# Patient Record
Sex: Female | Born: 1945 | Race: White | Hispanic: No | Marital: Married | Smoking: Never smoker
Health system: Southern US, Community
[De-identification: ages and names within clinical notes are randomized; demographics above are authoritative.]

## PROBLEM LIST (undated history)

## (undated) DIAGNOSIS — I10 Essential (primary) hypertension: Secondary | ICD-10-CM

## (undated) DIAGNOSIS — C801 Malignant (primary) neoplasm, unspecified: Secondary | ICD-10-CM

## (undated) HISTORY — PX: ABDOMINAL HYSTERECTOMY: SHX81

---

## 2013-03-23 ENCOUNTER — Ambulatory Visit: Payer: Self-pay | Admitting: Internal Medicine

## 2014-04-23 ENCOUNTER — Ambulatory Visit: Payer: Self-pay | Admitting: Internal Medicine

## 2015-04-09 ENCOUNTER — Other Ambulatory Visit: Payer: Self-pay | Admitting: Internal Medicine

## 2015-04-09 DIAGNOSIS — Z1231 Encounter for screening mammogram for malignant neoplasm of breast: Secondary | ICD-10-CM

## 2015-04-29 ENCOUNTER — Ambulatory Visit
Admission: RE | Admit: 2015-04-29 | Discharge: 2015-04-29 | Disposition: A | Discharge status: Home / Self Care | Payer: Medicare Other | Source: Ambulatory Visit | Attending: Internal Medicine | Admitting: Internal Medicine

## 2015-04-29 DIAGNOSIS — Z1231 Encounter for screening mammogram for malignant neoplasm of breast: Secondary | ICD-10-CM | POA: Diagnosis present

## 2015-04-29 HISTORY — DX: Malignant (primary) neoplasm, unspecified: C80.1

## 2016-04-27 ENCOUNTER — Other Ambulatory Visit: Payer: Self-pay | Admitting: Internal Medicine

## 2016-04-27 DIAGNOSIS — Z1231 Encounter for screening mammogram for malignant neoplasm of breast: Secondary | ICD-10-CM

## 2016-05-05 ENCOUNTER — Ambulatory Visit
Admission: RE | Admit: 2016-05-05 | Discharge: 2016-05-05 | Disposition: A | Discharge status: Home / Self Care | Payer: Medicare Other | Source: Ambulatory Visit | Attending: Internal Medicine | Admitting: Internal Medicine

## 2016-05-05 DIAGNOSIS — Z1231 Encounter for screening mammogram for malignant neoplasm of breast: Secondary | ICD-10-CM | POA: Diagnosis present

## 2016-08-17 ENCOUNTER — Encounter: Payer: Self-pay | Admitting: Emergency Medicine

## 2016-08-17 ENCOUNTER — Emergency Department
Admission: EM | Admit: 2016-08-17 | Discharge: 2016-08-17 | Disposition: A | Discharge status: Home / Self Care | Payer: Medicare Other | Attending: Student in an Organized Health Care Education/Training Program | Admitting: Student in an Organized Health Care Education/Training Program

## 2016-08-17 DIAGNOSIS — Z85828 Personal history of other malignant neoplasm of skin: Secondary | ICD-10-CM | POA: Insufficient documentation

## 2016-08-17 DIAGNOSIS — K529 Noninfective gastroenteritis and colitis, unspecified: Secondary | ICD-10-CM | POA: Diagnosis not present

## 2016-08-17 DIAGNOSIS — R197 Diarrhea, unspecified: Secondary | ICD-10-CM

## 2016-08-17 LAB — CBC WITH DIFFERENTIAL/PLATELET
BASOS ABS: 0 10*3/uL (ref 0–0.1)
BASOS PCT: 1 %
EOS ABS: 0.1 10*3/uL (ref 0–0.7)
Eosinophils Relative: 1 %
HCT: 43.9 % (ref 35.0–47.0)
HEMOGLOBIN: 14.9 g/dL (ref 12.0–16.0)
Lymphocytes Relative: 17 %
Lymphs Abs: 1.1 10*3/uL (ref 1.0–3.6)
MCH: 30.5 pg (ref 26.0–34.0)
MCHC: 34.1 g/dL (ref 32.0–36.0)
MCV: 89.6 fL (ref 80.0–100.0)
MONOS PCT: 11 %
Monocytes Absolute: 0.7 10*3/uL (ref 0.2–0.9)
Neutro Abs: 4.6 10*3/uL (ref 1.4–6.5)
Neutrophils Relative %: 70 %
Platelets: 241 10*3/uL (ref 150–440)
RBC: 4.89 MIL/uL (ref 3.80–5.20)
RDW: 13.1 % (ref 11.5–14.5)
WBC: 6.4 10*3/uL (ref 3.6–11.0)

## 2016-08-17 LAB — COMPREHENSIVE METABOLIC PANEL
ALBUMIN: 4.2 g/dL (ref 3.5–5.0)
ALK PHOS: 61 U/L (ref 38–126)
ALT: 23 U/L (ref 14–54)
ANION GAP: 9 (ref 5–15)
AST: 28 U/L (ref 15–41)
BUN: 17 mg/dL (ref 6–20)
CO2: 23 mmol/L (ref 22–32)
Calcium: 9.5 mg/dL (ref 8.9–10.3)
Chloride: 108 mmol/L (ref 101–111)
Creatinine, Ser: 0.66 mg/dL (ref 0.44–1.00)
GFR calc Af Amer: >60 mL/min (ref >60)
GFR calc non Af Amer: >60 mL/min (ref >60)
Glucose, Bld: 114 mg/dL — ABNORMAL HIGH (ref 65–99)
POTASSIUM: 3.7 mmol/L (ref 3.5–5.1)
SODIUM: 140 mmol/L (ref 135–145)
Total Bilirubin: 0.7 mg/dL (ref 0.3–1.2)
Total Protein: 6.9 g/dL (ref 6.5–8.1)

## 2016-08-17 LAB — LACTIC ACID, PLASMA: Lactic Acid, Venous: 1.3 mmol/L (ref 0.5–1.9)

## 2016-08-17 MED ORDER — SODIUM CHLORIDE 0.9 % IV BOLUS (SEPSIS)
1000.0000 mL | Freq: Once | INTRAVENOUS | Status: AC
  Administered 2016-08-17: 1000 mL via INTRAVENOUS

## 2016-08-17 MED ORDER — METRONIDAZOLE 500 MG PO TABS
500.0000 mg | ORAL_TABLET | Freq: Once | ORAL | Status: AC
  Administered 2016-08-17: 500 mg via ORAL
  Filled 2016-08-17: qty 1

## 2016-08-17 MED ORDER — METRONIDAZOLE 500 MG PO TABS: 500.0000 mg | ORAL_TABLET | Freq: Three times a day (TID) | ORAL | 0 refills | Status: AC

## 2016-08-17 MED ORDER — CIPROFLOXACIN HCL 500 MG PO TABS: 500.0000 mg | ORAL_TABLET | Freq: Two times a day (BID) | ORAL | 0 refills | Status: AC

## 2016-08-17 MED ORDER — SODIUM CHLORIDE 0.9 % IV BOLUS (SEPSIS): 1000.0000 mL | Freq: Once | INTRAVENOUS | Status: DC

## 2016-08-17 NOTE — ED Notes (Signed)
Patient denies pain and is resting comfortably.  

## 2016-08-17 NOTE — ED Triage Notes (Signed)
Pt to ed with c/o diarrhea x 4 weeks, approx 5 times per day.

## 2016-08-17 NOTE — ED Provider Notes (Addendum)
[facility] Emergency Department Provider Note    First MD Initiated Contact with Patient 08/17/16 1119     (approximate)  I have reviewed the triage vital signs and the nursing notes.   HISTORY  Chief Complaint Diarrhea    HPI Tracy Charles is a 71 y.o. female presents with several weeks of very foul-smelling "explosive" diarrhea. Is having 4-5 episodes per day. Diarrhea is nonbloody. No green stool. No dark black stool. Patient with recent travel to [place] and states that she thinks she got something while eating at the diner[employer]. States that the smell has become more foul. Has tried Imodium without any improvement. Denies any history of food allergies. No recent antibiotic use. [facility] and was directed to the ER. She denies any pain.   Past Medical History:  Diagnosis Date  . Cancer ([place])    skin ca   Family History  Problem Relation Age of Onset  . Breast cancer Maternal Aunt 13  . Breast cancer Maternal Grandmother     premenopausal   Past Surgical History:  Procedure Laterality Date  . ABDOMINAL HYSTERECTOMY     There are no active problems to display for this patient.     Prior to Admission medications   Medication Sig Start Date End Date Taking? Authorizing Provider  ciprofloxacin (CIPRO) 500 MG tablet Take 1 tablet (500 mg total) by mouth 2 (two) times daily. 08/17/16 08/27/16  Merlyn Lot, MD  metroNIDAZOLE (FLAGYL) 500 MG tablet Take 1 tablet (500 mg total) by mouth 3 (three) times daily. 08/17/16 08/24/16  Merlyn Lot, MD    Allergies Patient has no known allergies.    Social History Social History  Substance Use Topics  . Smoking status: Never Smoker  . Smokeless tobacco: Never Used  . Alcohol use No    Review of Systems Patient denies headaches, rhinorrhea, blurry vision, numbness, shortness of breath, chest pain, edema, cough, abdominal pain,  nausea, vomiting, diarrhea, dysuria, fevers, rashes or hallucinations unless otherwise stated above in HPI. ____________________________________________   PHYSICAL EXAM:  VITAL SIGNS: Vitals:   08/17/16 1041  BP: (!) 158/79  Pulse: 73  Resp: 16  Temp: 98.4 F (36.9 C)    Constitutional: Alert and oriented. Well appearing and in no acute distress. Eyes: Conjunctivae are normal. PERRL. EOMI. Head: Atraumatic. Nose: No congestion/rhinnorhea. Mouth/Throat: Mucous membranes are moist.  Oropharynx non-erythematous. Neck: No stridor. Painless ROM. No cervical spine tenderness to palpation Hematological/Lymphatic/Immunilogical: No cervical lymphadenopathy. Cardiovascular: Normal rate, regular rhythm. Grossly normal heart sounds.  Good peripheral circulation. Respiratory: Normal respiratory effort.  No retractions. Lungs CTAB. Gastrointestinal: Soft and nontender. No distention. No abdominal bruits. No CVA tenderness. Genitourinary:  Musculoskeletal: No lower extremity tenderness nor edema.  No joint effusions. Neurologic:  Normal speech and language. No gross focal neurologic deficits are appreciated. No gait instability. Skin:  Skin is warm, dry and intact. No rash noted. Psychiatric: Mood and affect are normal. Speech and behavior are normal.  ____________________________________________   LABS (all labs ordered are listed, but only abnormal results are displayed)  Results for orders placed or performed during the hospital encounter of 08/17/16 (from the past 24 hour(s))  CBC with Differential/Platelet     Status: None   Collection Time: 08/17/16 11:44 AM  Result Value Ref Range   WBC 6.4 3.6 - 11.0 K/uL   RBC 4.89 3.80 - 5.20 MIL/uL   Hemoglobin 14.9 12.0 - 16.0 g/dL  HCT 43.9 35.0 - 47.0 %   MCV 89.6 80.0 - 100.0 fL   MCH 30.5 26.0 - 34.0 pg   MCHC 34.1 32.0 - 36.0 g/dL   RDW 13.1 11.5 - 14.5 %   Platelets 241 150 - 440 K/uL   Neutrophils Relative % 70 %   Neutro Abs  4.6 1.4 - 6.5 K/uL   Lymphocytes Relative 17 %   Lymphs Abs 1.1 1.0 - 3.6 K/uL   Monocytes Relative 11 %   Monocytes Absolute 0.7 0.2 - 0.9 K/uL   Eosinophils Relative 1 %   Eosinophils Absolute 0.1 0 - 0.7 K/uL   Basophils Relative 1 %   Basophils Absolute 0.0 0 - 0.1 K/uL  Comprehensive metabolic panel     Status: Abnormal   Collection Time: 08/17/16 11:44 AM  Result Value Ref Range   Sodium 140 135 - 145 mmol/L   Potassium 3.7 3.5 - 5.1 mmol/L   Chloride 108 101 - 111 mmol/L   CO2 23 22 - 32 mmol/L   Glucose, Bld 114 (H) 65 - 99 mg/dL   BUN 17 6 - 20 mg/dL   Creatinine, Ser 0.66 0.44 - 1.00 mg/dL   Calcium 9.5 8.9 - 10.3 mg/dL   Total Protein 6.9 6.5 - 8.1 g/dL   Albumin 4.2 3.5 - 5.0 g/dL   AST 28 15 - 41 U/L   ALT 23 14 - 54 U/L   Alkaline Phosphatase 61 38 - 126 U/L   Total Bilirubin 0.7 0.3 - 1.2 mg/dL   GFR calc non Af Amer >60 >60 mL/min   GFR calc Af Amer >60 >60 mL/min   Anion gap 9 5 - 15  Lactic acid, plasma     Status: None   Collection Time: 08/17/16 11:44 AM  Result Value Ref Range   Lactic Acid, Venous 1.3 0.5 - 1.9 mmol/L   ____________________________________________ ____________________________________________  RADIOLOGY    ____________________________________________   PROCEDURES  Procedure(s) performed:  Procedures    Critical Care performed: no ____________________________________________   INITIAL IMPRESSION / ASSESSMENT AND PLAN / ED COURSE  Pertinent labs & imaging results that were available during my care of the patient were reviewed by me and considered in my medical decision making (see chart for details).  DDX: enteritis, colitis, diverticulitis, dehydration  Kashira Behunin is a 71 y.o. who presents to the ED with long history of diarrhea as described above.Patient is AFVSS in ED. Exam as above. Given current presentation have considered the above differential. Her abdominal exam is soft and benign. Do not feel that CT  imaging is clinically indicated. Patient likely suffering ROM some component of traveler's diarrhea however foul-smelling and large amount of flatulence would suggest a component of Giardia. Will send for stool cultures. Blood work is otherwise reassuring. IV fluids given for dehydration. No evidence of acidosis. We will start patient on antibiotics as symptoms not improving after several weeks of conservative management. Not clinically consistent with C. difficile. Patient has follow-up with PCP.   Patient was able to tolerate PO and was able to ambulate with a steady gait.  Have discussed with the patient and available family all diagnostics and treatments performed thus far and all questions were answered to the best of my ability. The patient demonstrates understanding and agreement with plan.        ____________________________________________   FINAL CLINICAL IMPRESSION(S) / ED DIAGNOSES  Final diagnoses:  Diarrhea of presumed infectious origin      NEW MEDICATIONS  STARTED DURING THIS VISIT:  New Prescriptions   CIPROFLOXACIN (CIPRO) 500 MG TABLET    Take 1 tablet (500 mg total) by mouth 2 (two) times daily.   METRONIDAZOLE (FLAGYL) 500 MG TABLET    Take 1 tablet (500 mg total) by mouth 3 (three) times daily.     Note:  This document was prepared using Dragon voice recognition software and may include unintentional dictation errors.    Merlyn Lot, MD 08/17/16 [place], MD 08/17/16 1248

## 2017-03-15 ENCOUNTER — Other Ambulatory Visit: Payer: Self-pay | Admitting: Internal Medicine

## 2017-03-15 ENCOUNTER — Ambulatory Visit
Admission: RE | Admit: 2017-03-15 | Discharge: 2017-03-15 | Disposition: A | Discharge status: Home / Self Care | Payer: Medicare Other | Source: Ambulatory Visit | Attending: Internal Medicine | Admitting: Internal Medicine

## 2017-03-15 DIAGNOSIS — M4316 Spondylolisthesis, lumbar region: Secondary | ICD-10-CM | POA: Diagnosis not present

## 2017-03-15 DIAGNOSIS — M48061 Spinal stenosis, lumbar region without neurogenic claudication: Secondary | ICD-10-CM | POA: Insufficient documentation

## 2017-03-15 DIAGNOSIS — M5432 Sciatica, left side: Secondary | ICD-10-CM | POA: Insufficient documentation

## 2017-03-15 DIAGNOSIS — M47816 Spondylosis without myelopathy or radiculopathy, lumbar region: Secondary | ICD-10-CM | POA: Insufficient documentation

## 2017-03-15 DIAGNOSIS — M79605 Pain in left leg: Secondary | ICD-10-CM

## 2017-03-15 DIAGNOSIS — M5127 Other intervertebral disc displacement, lumbosacral region: Secondary | ICD-10-CM | POA: Insufficient documentation

## 2017-03-15 DIAGNOSIS — M545 Low back pain: Secondary | ICD-10-CM

## 2017-05-24 ENCOUNTER — Other Ambulatory Visit: Payer: Self-pay | Admitting: Internal Medicine

## 2017-05-24 DIAGNOSIS — Z1231 Encounter for screening mammogram for malignant neoplasm of breast: Secondary | ICD-10-CM

## 2017-05-31 ENCOUNTER — Ambulatory Visit
Admission: RE | Admit: 2017-05-31 | Discharge: 2017-05-31 | Disposition: A | Discharge status: Home / Self Care | Payer: Medicare Other | Source: Ambulatory Visit | Attending: Internal Medicine | Admitting: Internal Medicine

## 2017-05-31 DIAGNOSIS — Z1231 Encounter for screening mammogram for malignant neoplasm of breast: Secondary | ICD-10-CM

## 2018-05-17 ENCOUNTER — Other Ambulatory Visit: Payer: Self-pay | Admitting: Internal Medicine

## 2018-05-17 DIAGNOSIS — Z1231 Encounter for screening mammogram for malignant neoplasm of breast: Secondary | ICD-10-CM

## 2018-06-01 ENCOUNTER — Ambulatory Visit
Admission: RE | Admit: 2018-06-01 | Discharge: 2018-06-01 | Disposition: A | Discharge status: Home / Self Care | Payer: Medicare Other | Source: Ambulatory Visit | Attending: Internal Medicine | Admitting: Internal Medicine

## 2018-06-01 ENCOUNTER — Encounter (INDEPENDENT_AMBULATORY_CARE_PROVIDER_SITE_OTHER): Payer: Self-pay

## 2018-06-01 DIAGNOSIS — Z1231 Encounter for screening mammogram for malignant neoplasm of breast: Secondary | ICD-10-CM

## 2019-01-09 ENCOUNTER — Other Ambulatory Visit: Payer: Self-pay | Admitting: Unknown Physician Specialty

## 2019-01-09 DIAGNOSIS — R220 Localized swelling, mass and lump, head: Secondary | ICD-10-CM

## 2019-01-17 ENCOUNTER — Ambulatory Visit
Admission: RE | Admit: 2019-01-17 | Discharge: 2019-01-17 | Disposition: A | Discharge status: Home / Self Care | Payer: Medicare Other | Source: Ambulatory Visit | Attending: Unknown Physician Specialty | Admitting: Unknown Physician Specialty

## 2019-01-17 ENCOUNTER — Other Ambulatory Visit: Payer: Self-pay

## 2019-01-17 DIAGNOSIS — R220 Localized swelling, mass and lump, head: Secondary | ICD-10-CM | POA: Diagnosis present

## 2019-01-17 HISTORY — DX: Essential (primary) hypertension: I10

## 2019-01-17 LAB — POCT I-STAT CREATININE: Creatinine, Ser: 0.6 mg/dL (ref 0.44–1.00)

## 2019-01-17 MED ORDER — IOHEXOL 300 MG/ML  SOLN
75.0000 mL | Freq: Once | INTRAMUSCULAR | Status: AC | PRN
  Administered 2019-01-17: 14:00:00 75 mL via INTRAVENOUS

## 2019-01-19 ENCOUNTER — Other Ambulatory Visit: Payer: Self-pay | Admitting: Unknown Physician Specialty

## 2019-01-19 ENCOUNTER — Other Ambulatory Visit (HOSPITAL_COMMUNITY): Payer: Self-pay | Admitting: Unknown Physician Specialty

## 2019-01-19 DIAGNOSIS — R911 Solitary pulmonary nodule: Secondary | ICD-10-CM

## 2019-01-22 ENCOUNTER — Other Ambulatory Visit: Payer: Self-pay | Admitting: Unknown Physician Specialty

## 2019-01-22 DIAGNOSIS — R221 Localized swelling, mass and lump, neck: Secondary | ICD-10-CM

## 2019-01-24 ENCOUNTER — Other Ambulatory Visit: Payer: Self-pay | Admitting: Radiology

## 2019-01-25 ENCOUNTER — Other Ambulatory Visit: Payer: Self-pay

## 2019-01-25 ENCOUNTER — Ambulatory Visit
Admission: RE | Admit: 2019-01-25 | Discharge: 2019-01-25 | Disposition: A | Discharge status: Home / Self Care | Payer: Medicare Other | Source: Ambulatory Visit | Attending: Unknown Physician Specialty | Admitting: Unknown Physician Specialty

## 2019-01-25 DIAGNOSIS — R221 Localized swelling, mass and lump, neck: Secondary | ICD-10-CM | POA: Diagnosis present

## 2019-01-25 MED ORDER — SODIUM CHLORIDE 0.9 % IV SOLN: INTRAVENOUS | Status: DC

## 2019-01-25 NOTE — Procedures (Signed)
Interventional Radiology Procedure:   Indications: Palpable left submandibular lesion   Procedure: US guided FNA and core biopsy  Findings: Irregular poor-defined lesion.  4 FNAs and 3 cores obtained.  Complications: None     EBL: less than 10 ml  Plan: Discharge to home.   Gemma Ruan R. Anselm Pancoast, MD  Pager: [phone]

## 2019-01-25 NOTE — Discharge Instructions (Signed)
Needle Biopsy, Care After °These instructions tell you how to care for yourself after your procedure. Your doctor may also give you more specific instructions. Call your doctor if you have any problems or questions. °What can I expect after the procedure? °After the procedure, it is common to have: °· Soreness. °· Bruising. °· Mild pain. °Follow these instructions at home: ° °· Return to your normal activities as told by your doctor. Ask your doctor what activities are safe for you. °· Take over-the-counter and prescription medicines only as told by your doctor. °· Wash your hands with soap and water before you change your bandage (dressing). If you cannot use soap and water, use hand sanitizer. °· Follow instructions from your doctor about: °? How to take care of your puncture site. °? When and how to change your bandage. °? When to remove your bandage. °· Check your puncture site every day for signs of infection. Watch for: °? Redness, swelling, or pain. °? Fluid or blood.  °? Pus or a bad smell. °? Warmth. °· Do not take baths, swim, or use a hot tub until your doctor approves. Ask your doctor if you may take showers. You may only be allowed to take sponge baths. °· Keep all follow-up visits as told by your doctor. This is important. °Contact a doctor if you have: °· A fever. °· Redness, swelling, or pain at the puncture site, and it lasts longer than a few days. °· Fluid, blood, or pus coming from the puncture site. °· Warmth coming from the puncture site. °Get help right away if: °· You have a lot of bleeding from the puncture site. °Summary °· After the procedure, it is common to have soreness, bruising, or mild pain at the puncture site. °· Check your puncture site every day for signs of infection, such as redness, swelling, or pain. °· Get help right away if you have severe bleeding from your puncture site. °This information is not intended to replace advice given to you by your health care provider. Make  sure you discuss any questions you have with your health care provider. °Document Released: 04/01/2008 Document Revised: 05/02/2017 Document Reviewed: 05/02/2017 °Elsevier Patient Education © 2020 [employer] ° °

## 2019-01-26 LAB — CYTOLOGY - NON PAP

## 2019-01-26 LAB — SURGICAL PATHOLOGY

## 2019-04-20 ENCOUNTER — Other Ambulatory Visit: Payer: Self-pay

## 2019-04-20 ENCOUNTER — Ambulatory Visit
Admission: RE | Admit: 2019-04-20 | Discharge: 2019-04-20 | Disposition: A | Discharge status: Home / Self Care | Payer: Medicare Other | Source: Ambulatory Visit | Attending: Unknown Physician Specialty | Admitting: Unknown Physician Specialty

## 2019-04-20 DIAGNOSIS — R911 Solitary pulmonary nodule: Secondary | ICD-10-CM | POA: Insufficient documentation

## 2019-05-16 ENCOUNTER — Other Ambulatory Visit: Payer: Self-pay | Admitting: Internal Medicine

## 2019-05-16 DIAGNOSIS — Z1231 Encounter for screening mammogram for malignant neoplasm of breast: Secondary | ICD-10-CM

## 2019-05-24 ENCOUNTER — Other Ambulatory Visit: Payer: Self-pay | Admitting: Internal Medicine

## 2019-05-24 DIAGNOSIS — R918 Other nonspecific abnormal finding of lung field: Secondary | ICD-10-CM

## 2019-06-04 ENCOUNTER — Ambulatory Visit
Admission: RE | Admit: 2019-06-04 | Discharge: 2019-06-04 | Disposition: A | Discharge status: Home / Self Care | Payer: Medicare Other | Source: Ambulatory Visit | Attending: Internal Medicine | Admitting: Internal Medicine

## 2019-06-04 ENCOUNTER — Other Ambulatory Visit: Payer: Self-pay

## 2019-06-04 DIAGNOSIS — Z1231 Encounter for screening mammogram for malignant neoplasm of breast: Secondary | ICD-10-CM | POA: Diagnosis not present

## 2019-10-19 ENCOUNTER — Other Ambulatory Visit: Payer: Self-pay | Admitting: Family Medicine

## 2019-10-19 DIAGNOSIS — M5442 Lumbago with sciatica, left side: Secondary | ICD-10-CM

## 2019-11-06 ENCOUNTER — Ambulatory Visit
Admission: RE | Admit: 2019-11-06 | Discharge: 2019-11-06 | Disposition: A | Discharge status: Home / Self Care | Payer: Medicare Other | Source: Ambulatory Visit | Attending: Family Medicine | Admitting: Family Medicine

## 2019-11-06 ENCOUNTER — Other Ambulatory Visit: Payer: Self-pay

## 2019-11-06 DIAGNOSIS — G8929 Other chronic pain: Secondary | ICD-10-CM | POA: Insufficient documentation

## 2019-11-06 DIAGNOSIS — M5442 Lumbago with sciatica, left side: Secondary | ICD-10-CM | POA: Diagnosis present

## 2019-11-19 ENCOUNTER — Other Ambulatory Visit: Payer: Self-pay

## 2019-11-19 ENCOUNTER — Ambulatory Visit
Admission: RE | Admit: 2019-11-19 | Discharge: 2019-11-19 | Disposition: A | Discharge status: Home / Self Care | Payer: Medicare Other | Source: Ambulatory Visit | Attending: Internal Medicine | Admitting: Internal Medicine

## 2019-11-19 DIAGNOSIS — R918 Other nonspecific abnormal finding of lung field: Secondary | ICD-10-CM | POA: Insufficient documentation

## 2020-03-04 ENCOUNTER — Other Ambulatory Visit: Payer: Self-pay | Admitting: Internal Medicine

## 2020-03-04 DIAGNOSIS — Z1231 Encounter for screening mammogram for malignant neoplasm of breast: Secondary | ICD-10-CM

## 2020-06-25 ENCOUNTER — Ambulatory Visit
Admission: RE | Admit: 2020-06-25 | Discharge: 2020-06-25 | Disposition: A | Discharge status: Home / Self Care | Payer: Medicare Other | Source: Ambulatory Visit | Attending: Internal Medicine | Admitting: Internal Medicine

## 2020-06-25 ENCOUNTER — Other Ambulatory Visit: Payer: Self-pay

## 2020-06-25 DIAGNOSIS — Z1231 Encounter for screening mammogram for malignant neoplasm of breast: Secondary | ICD-10-CM | POA: Diagnosis present

## 2020-12-30 IMAGING — CT CT NECK W/ CM
3 of 4 series · 13 of 33 positions shown, 16 images · IV contrast (omnipaque)
Comparison: None.

CLINICAL DATA: 72-year-old female with palpable abnormality of the
left neck discovered 2 months ago, painful to palpation. History of
basal cell carcinoma.

EXAM:
CT NECK WITH CONTRAST
TECHNIQUE: Multidetector CT imaging of the neck was performed using the
standard protocol following the bolus administration of intravenous
contrast.
CONTRAST:  75mL OMNIPAQUE IOHEXOL 300 MG/ML  SOLN

[Series 2: axial neck · axial · 0.51mm/px · z∈[-288,-140]mm · 5 of 112 slices shown, 7 images]
[im 19/112  soft-tissue]
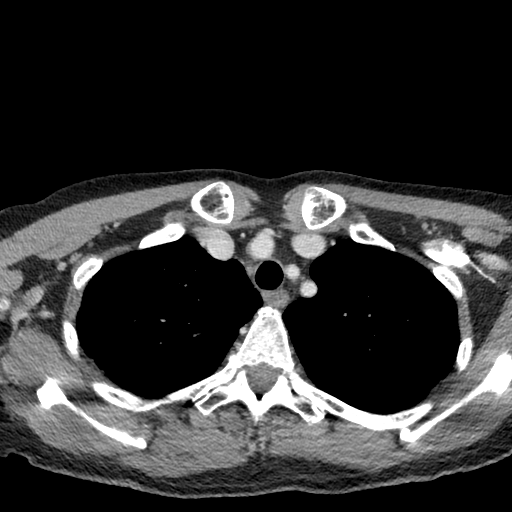
[im 19/112  bone]
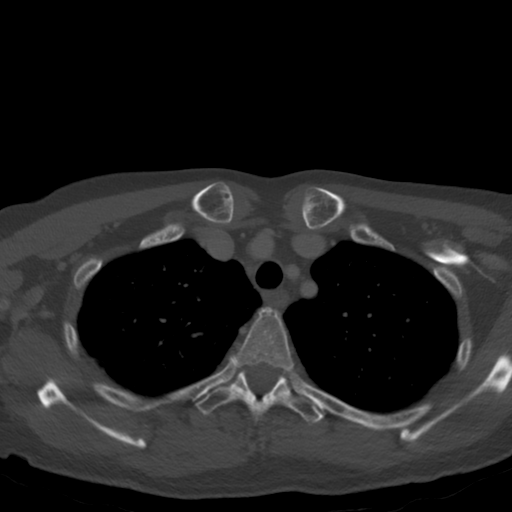
[im 38/112  bone]
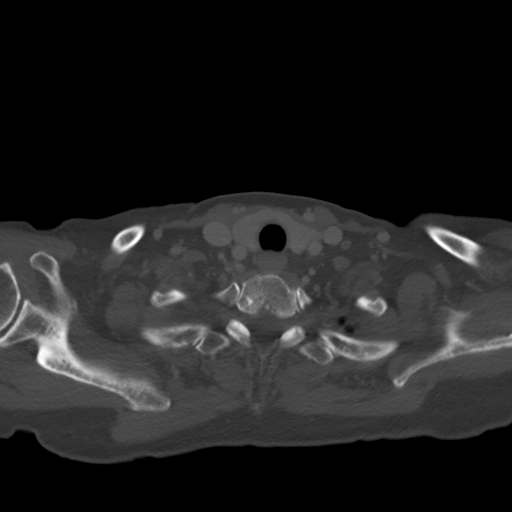
[im 56/112  bone]
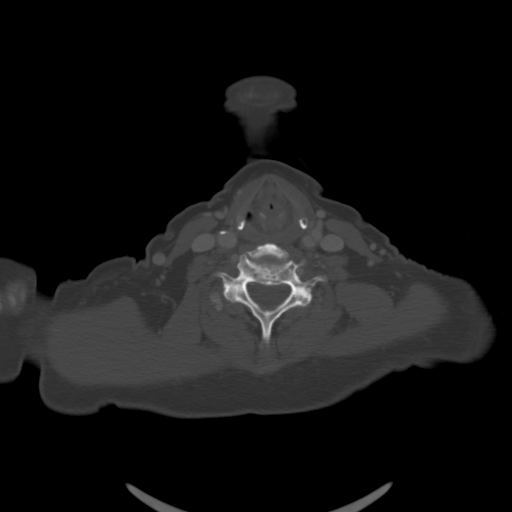
[im 75/112  bone]
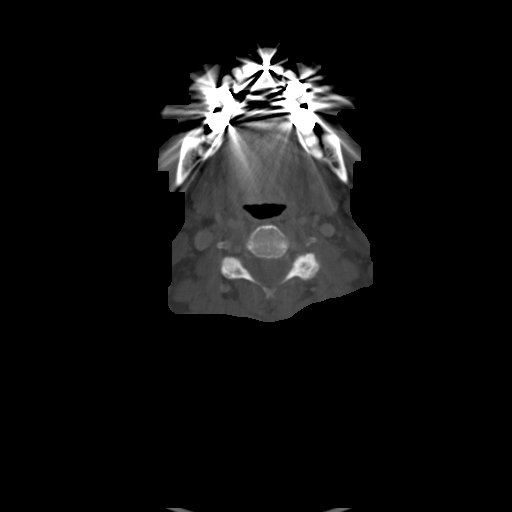
[im 93/112  soft-tissue]
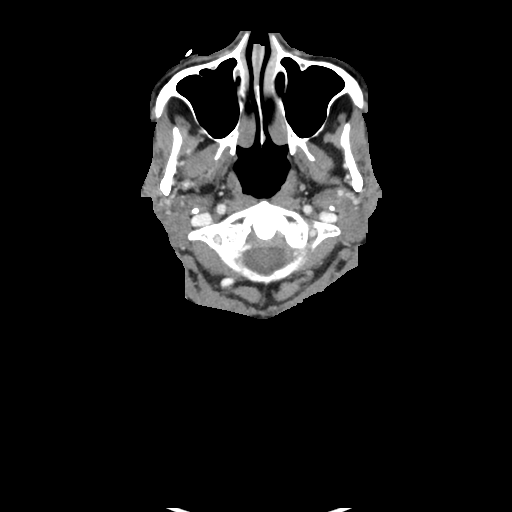
[im 93/112  bone]
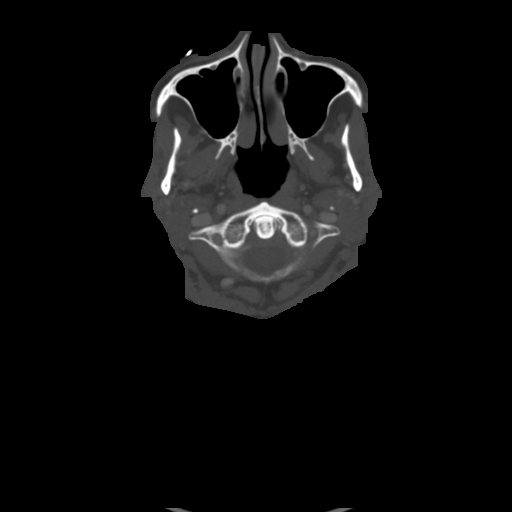

[Series 5: sag neck · sagittal · 0.44mm/px · 5 of 83 slices shown, 6 images]
[im 28/83  bone]
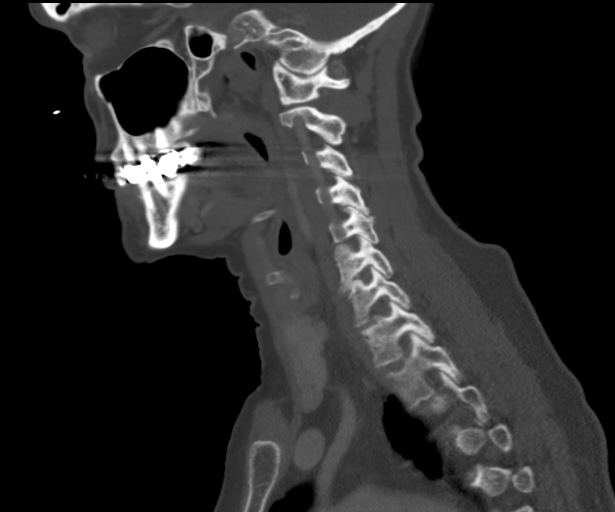
[im 35/83  bone]
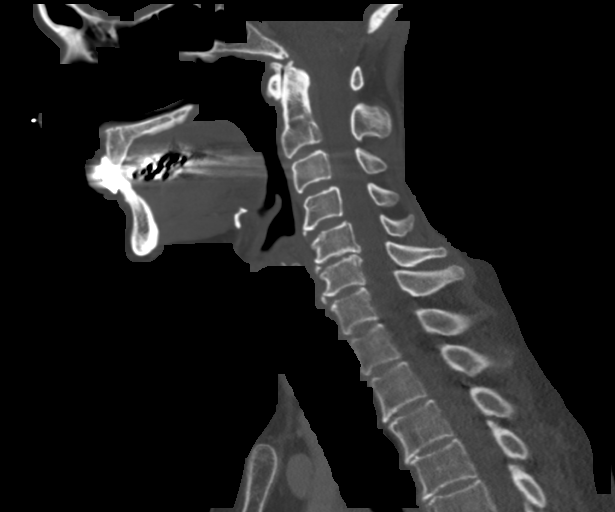
[im 42/83  soft-tissue]
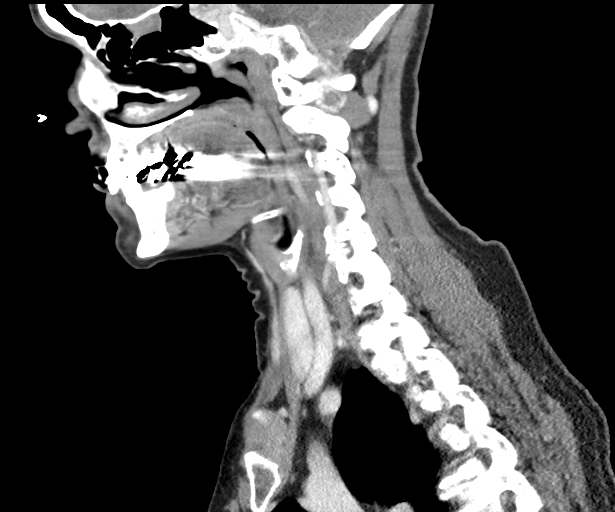
[im 42/83  bone]
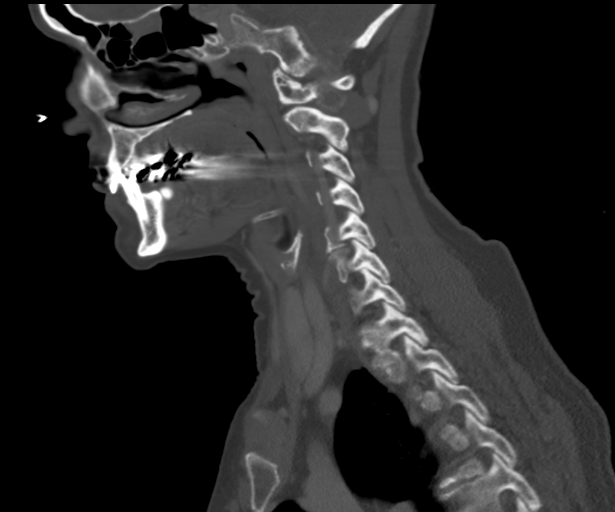
[im 48/83  bone]
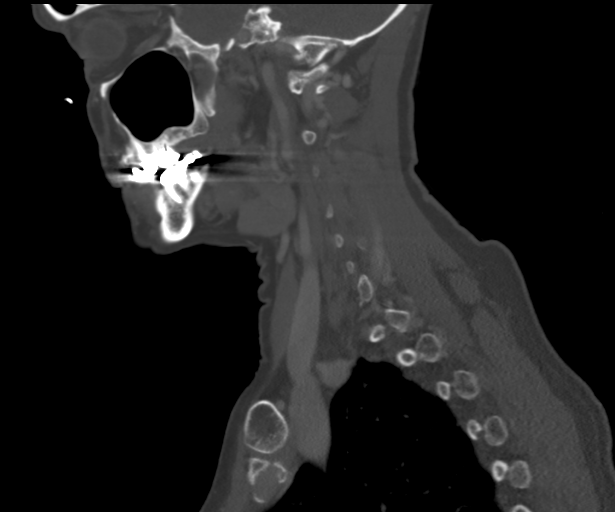
[im 55/83  bone]
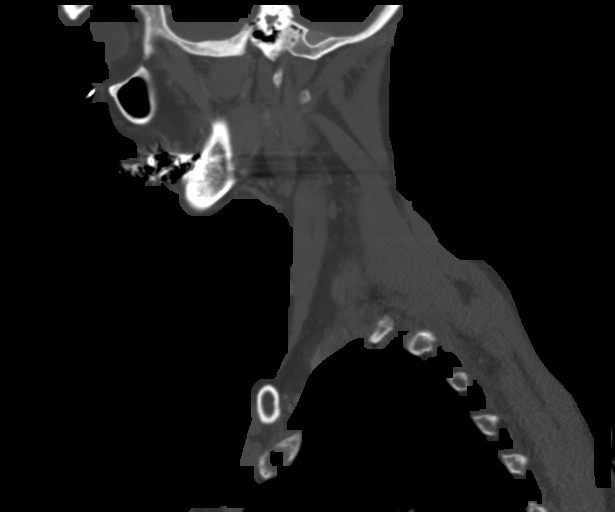

[Series 6: cor neck · coronal · 0.44mm/px · 3 of 120 slices shown]
[im 24/120  bone]
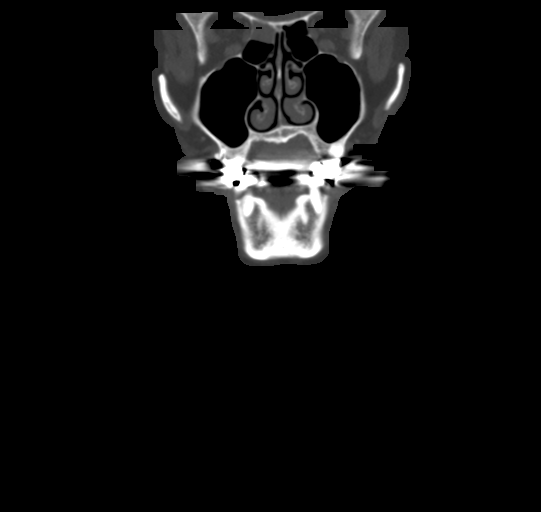
[im 48/120  bone]
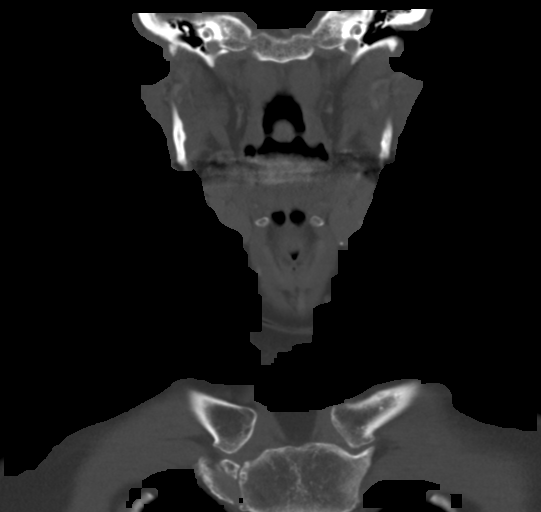
[im 72/120  bone]
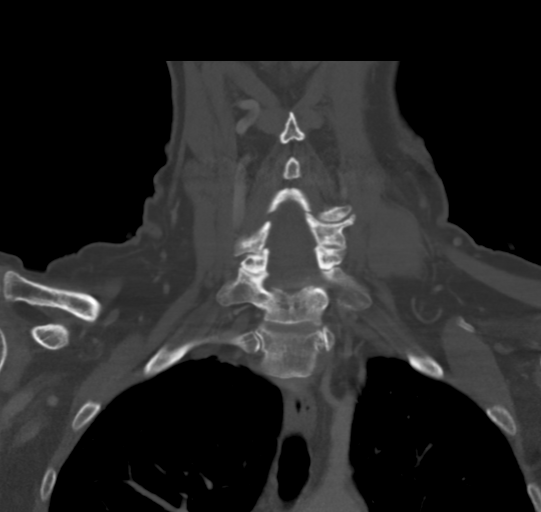

[13 of 33 positions shown; findings below may reference images not displayed]

FINDINGS: Pharynx and larynx: The glottis is closed. Laryngeal contours are
otherwise normal. Pharyngeal contours are within normal limits.
Negative parapharyngeal spaces. Negative retropharyngeal space.

Salivary glands: Negative sublingual space and visible sublingual
glands.

Palpable abnormality marked on series 2, image 50 and corresponds to
an asymmetric small nodular and somewhat indistinct soft tissue mass
extending from the left submandibular space to the left platysma.
This abuts the undersurface of the mandible on coronal image 39. The
lesion is 11 x 12 x 14 millimeters (AP by transverse by CC). There
might be focal extension through the platysma on coronal image 39.
The adjacent mandible cortex does not appear eroded. However, in
continuity with the lesion there is inflammatory periapical lucency
of the left mandible wisdom tooth with medial cortical breakthrough
of the alveolar process (coronal image 37) which is in continuity
with the palpable lesion.

The palpable nodule also abuts the right submandibular gland but
seems to be separate on series 2, image 46. There is punctate left
submandibular gland sialolithiasis. No enlargement of the left
submandibular duct. The right submandibular gland and both parotid
glands are within normal limits.

Thyroid: Negative.

Lymph nodes: Negative. No lymphadenopathy. Palpable lesion is
described above.

Vascular: Major vascular structures in the neck and at the skull
base are patent. There is a partially retropharyngeal course of the
left ICA.

Limited intracranial: Negative.

Visualized orbits: Negative.

Mastoids and visualized paranasal sinuses: Mild ethmoid mucosal
thickening. Small left frontal sinus osteoma (series 4, image 1,
normal variant). Visible mastoids and tympanic cavities are clear.

Skeleton: Left mandible wisdom tooth abnormality as stated above. No
other acute dental finding identified. No other acute osseous
abnormality identified. Mild for age cervical spine degeneration.

Upper chest: Small 10 millimeter area of ground-glass opacity in the
right lung apex on series 4, image 87. Otherwise negative visible
upper lung parenchyma. Negative visible superior mediastinum.
IMPRESSION: 1. Palpable lesion clearly identified as a nodular 11 x 12 x 14 mm
left submandibular space soft tissue mass. This appears to be an
Odontogenic Infectious sequelae, probably a Phlegmon related to the
left mandible wisdom tooth as described above.
2. No other complicating features, and otherwise negative neck soft
tissues.
3. Small 10 mm area of ground-glass opacity in the right lung apex
is indeterminate and initial follow-up by chest CT without contrast
is recommended in 3 months to confirm persistence.
This recommendation follows the consensus statement: Recommendations
for the Management of Subsolid Pulmonary Nodules Detected at CT: A
Statement from the [HOSPITAL] as published in Radiology

## 2021-05-03 ENCOUNTER — Ambulatory Visit
Admission: EM | Admit: 2021-05-03 | Discharge: 2021-05-03 | Disposition: A | Discharge status: Home / Self Care | Payer: Medicare Other

## 2021-05-03 ENCOUNTER — Encounter: Payer: Self-pay | Admitting: Emergency Medicine

## 2021-05-03 ENCOUNTER — Other Ambulatory Visit: Payer: Self-pay

## 2021-05-03 DIAGNOSIS — U071 COVID-19: Secondary | ICD-10-CM

## 2021-05-03 MED ORDER — BENZONATATE 100 MG PO CAPS: 200.0000 mg | ORAL_CAPSULE | Freq: Three times a day (TID) | ORAL | 0 refills | Status: DC

## 2021-05-03 MED ORDER — IPRATROPIUM BROMIDE 0.06 % NA SOLN: 2.0000 | Freq: Four times a day (QID) | NASAL | 12 refills | Status: AC

## 2021-05-03 MED ORDER — NIRMATRELVIR/RITONAVIR (PAXLOVID)TABLET
3.0000 | ORAL_TABLET | Freq: Two times a day (BID) | ORAL | 0 refills | Status: AC
Start: 2021-05-03 — End: 2021-05-08

## 2021-05-03 MED ORDER — PROMETHAZINE-DM 6.25-15 MG/5ML PO SYRP: 5.0000 mL | ORAL_SOLUTION | Freq: Four times a day (QID) | ORAL | 0 refills | Status: DC | PRN

## 2021-05-03 NOTE — ED Triage Notes (Signed)
Patient states that her husband was diagnosed with COVID last week.  Patient states that she developed a cough and congestion that started yesterday. Patient states that she took a home covid test and it was positive.

## 2021-05-03 NOTE — ED Provider Notes (Signed)
[facility]    CSN: [id number] Arrival date & time: 05/03/21  1158      History   Chief Complaint Chief Complaint  Patient presents with   Covid Positive   Cough    HPI Tracy Charles is a 76 y.o. female.   HPI  76 year old female here for evaluation of respiratory complaints.  Patient reports that she developed nasal congestion and a cough last night and tested positive for COVID this morning.  Her husband is currently recovering from [place] at home.  She reports that she has had a mildly elevated temp that may have exceeded 100.2.  She denies any nasal discharge but is complaining of postnasal drip, sore throat, left ear pressure, headache, and nonproductive cough.  She denies shortness of breath or wheezing, GI complaints, or body aches.  Past Medical History:  Diagnosis Date   Cancer ([place])    skin ca   Hypertension     There are no problems to display for this patient.   Past Surgical History:  Procedure Laterality Date   ABDOMINAL HYSTERECTOMY      OB History   No obstetric history on file.      Home Medications    Prior to Admission medications   Medication Sig Start Date End Date Taking? Authorizing Provider  atorvastatin (LIPITOR) 20 MG tablet Take 20 mg by mouth daily. 03/30/21  Yes [provider]  benzonatate (TESSALON) 100 MG capsule Take 2 capsules (200 mg total) by mouth every 8 (eight) hours. 05/03/21  Yes Margarette Canada, NP  ipratropium (ATROVENT) 0.06 % nasal spray Place 2 sprays into both nostrils 4 (four) times daily. 05/03/21  Yes Margarette Canada, NP  nirmatrelvir/ritonavir EUA (PAXLOVID) 20 x 150 MG & 10 x 100MG  TABS Take 3 tablets by mouth 2 (two) times daily for 5 days. Patient GFR is 97. Take nirmatrelvir (150 mg) two tablets twice daily for 5 days and ritonavir (100 mg) one tablet twice daily for 5 days. 05/03/21 05/08/21 Yes Margarette Canada, NP  promethazine-dextromethorphan (PROMETHAZINE-DM) 6.25-15 MG/5ML syrup Take 5 mLs by  mouth 4 (four) times daily as needed. 05/03/21  Yes Margarette Canada, NP  valsartan (DIOVAN) 80 MG tablet Take 80 mg by mouth daily. 03/10/21  Yes [provider]    Family History Family History  Problem Relation Age of Onset   Breast cancer Maternal Aunt 3   Breast cancer Maternal Grandmother        premenopausal    Social History Social History   Tobacco Use   Smoking status: Never   Smokeless tobacco: Never  Vaping Use   Vaping Use: Never used  Substance Use Topics   Alcohol use: No   Drug use: No     Allergies   Patient has no known allergies.   Review of Systems Review of Systems  Constitutional:  Positive for fever. Negative for activity change and appetite change.  HENT:  Positive for congestion, ear pain, postnasal drip and sore throat. Negative for rhinorrhea.   Respiratory:  Positive for cough. Negative for shortness of breath and wheezing.   Gastrointestinal:  Negative for diarrhea, nausea and vomiting.  Musculoskeletal:  Negative for arthralgias and myalgias.  Skin:  Negative for rash.  Neurological:  Positive for headaches.  Hematological: Negative.   Psychiatric/Behavioral: Negative.      Physical Exam Triage Vital Signs ED Triage Vitals  Enc Vitals Group     BP 05/03/21 1323 (!) 144/67     Pulse Rate  05/03/21 1323 79     Resp 05/03/21 1323 14     Temp 05/03/21 1323 98.8 F (37.1 C)     Temp Source 05/03/21 1323 Oral     SpO2 05/03/21 1323 99 %     Weight 05/03/21 1320 132 lb 0.9 oz (59.9 kg)     Height 05/03/21 1320 5\' 5"  (1.651 m)     Head Circumference --      Peak Flow --      Pain Score 05/03/21 1320 0     Pain Loc --      Pain Edu? --      Excl. in [place]? --    No data found.  Updated Vital Signs BP (!) 144/67 (BP Location: Left Arm)    Pulse 79    Temp 98.8 F (37.1 C) (Oral)    Resp 14    Ht 5\' 5"  (1.651 m)    Wt 132 lb 0.9 oz (59.9 kg)    SpO2 99%    BMI 21.98 kg/m   Visual Acuity Right Eye Distance:   Left Eye Distance:    Bilateral Distance:    Right Eye Near:   Left Eye Near:    Bilateral Near:     Physical Exam Vitals and nursing note reviewed.  Constitutional:      General: She is not in acute distress.    Appearance: Normal appearance. She is not ill-appearing.  HENT:     Head: Normocephalic and atraumatic.     Right Ear: Tympanic membrane, ear canal and external ear normal. There is no impacted cerumen.     Left Ear: Tympanic membrane, ear canal and external ear normal. There is no impacted cerumen.     Nose: Congestion and rhinorrhea present.     Mouth/Throat:     Mouth: Mucous membranes are moist.     Pharynx: Oropharynx is clear. Posterior oropharyngeal erythema present.  Cardiovascular:     Rate and Rhythm: Normal rate and regular rhythm.     Pulses: Normal pulses.     Heart sounds: Normal heart sounds. No murmur heard.   No friction rub. No gallop.  Pulmonary:     Effort: Pulmonary effort is normal.     Breath sounds: No wheezing, rhonchi or rales.  Musculoskeletal:     Cervical back: Normal range of motion and neck supple.  Lymphadenopathy:     Cervical: No cervical adenopathy.  Skin:    General: Skin is warm and dry.     Capillary Refill: Capillary refill takes less than 2 seconds.     Findings: No erythema or rash.  Neurological:     General: No focal deficit present.     Mental Status: She is alert and oriented to person, place, and time.  Psychiatric:        Mood and Affect: Mood normal.        Behavior: Behavior normal.        Thought Content: Thought content normal.        Judgment: Judgment normal.     UC Treatments / Results  Labs (all labs ordered are listed, but only abnormal results are displayed) Labs Reviewed - No data to display  EKG   Radiology No results found.  Procedures Procedures (including critical care time)  Medications Ordered in UC Medications - No data to display  Initial Impression / Assessment and Plan / UC Course  I have reviewed  the triage vital signs and the nursing notes.  Pertinent labs & imaging results that were available during my care of the patient were reviewed by me and considered in my medical decision making (see chart for details).  Patient is a nontoxic-appearing 76 year old female here for evaluation of COVID symptoms after testing COVID-positive at home this morning.  Her husband is currently being treated for COVID as well.  Her symptoms largely consist of nasal congestion with postnasal drip, nonproductive cough, sore throat that is largely resolved, left ear popping, headache, and elevated temp that she reports registered maximum 100.2.  Her physical exam reveals pearly gray tympanic membranes bilaterally with normal light reflex and clear external auditory canals.  Nasal mucosa is erythematous and edematous with clear nasal discharge in both nares.  Oropharyngeal exam reveals mild posterior oropharyngeal erythema with clear postnasal drip.  No cervical lymphadenopathy appreciated exam.  Cardiopulmonary exam reveals clear lung sounds in all fields.  She takes Lipitor for cholesterol and she has labs drawn every 6 months.  She had her CMP drawn on 01/15/2021 which showed a GFR of 97.  We will place patient on Paxlovid for treatment of COVID-19.  I have advised her to hold her Lipitor while she is on the Paxlovid and she can resume it 24 hours after she has completed her dosing.  [place] of Atrovent nasal spray, Tessalon Perles, and Promethazine DM cough syrup to help with cough and congestion.  Tylenol and ibuprofen as needed for fever and pain.   Final Clinical Impressions(s) / UC Diagnoses   Final diagnoses:  [id number]     Discharge Instructions      You will have to quarantine for 5 days from the start of your symptoms.  After 5 days you can break quarantine if your symptoms have improved and you have not had a fever for 24 hours without taking Tylenol or ibuprofen.  Use over-the-counter Tylenol and  ibuprofen as needed for body aches and fever.  Use the Atrovent nasal spray, 2 squirts in each nostril every 6 hours, as needed for runny nose and postnasal drip.  Use the Tessalon Perles every 8 hours during the day.  Take them with a small sip of water.  They may give you some numbness to the base of your tongue or a metallic taste in your mouth, this is normal.  Use the Promethazine DM cough syrup at bedtime for cough and congestion.  It will make you drowsy so do not take it during the day.  Take the Paxlovid twice daily for 5 days for treatment of COVID-19. Hold your Lipitor while taking this medication and you can resume 24 hours after completion.  If you develop any increased shortness of breath-especially at rest, you are unable to speak in full sentences, or is a late sign your lips are turning blue you need to go the ER for evaluation.      ED Prescriptions     Medication Sig Dispense Auth. Provider   nirmatrelvir/ritonavir EUA (PAXLOVID) 20 x 150 MG & 10 x 100MG  TABS Take 3 tablets by mouth 2 (two) times daily for 5 days. Patient GFR is 97. Take nirmatrelvir (150 mg) two tablets twice daily for 5 days and ritonavir (100 mg) one tablet twice daily for 5 days. 30 tablet Margarette Canada, NP   benzonatate (TESSALON) 100 MG capsule Take 2 capsules (200 mg total) by mouth every 8 (eight) hours. 21 capsule Margarette Canada, NP   ipratropium (ATROVENT) 0.06 % nasal spray Place 2 sprays into both nostrils 4 (four)  times daily. 15 mL Margarette Canada, NP   promethazine-dextromethorphan (PROMETHAZINE-DM) 6.25-15 MG/5ML syrup Take 5 mLs by mouth 4 (four) times daily as needed. 118 mL Margarette Canada, NP      PDMP not reviewed this encounter.   Margarette Canada, NP 05/03/21 1428

## 2021-05-03 NOTE — Discharge Instructions (Signed)
You will have to quarantine for 5 days from the start of your symptoms.  After 5 days you can break quarantine if your symptoms have improved and you have not had a fever for 24 hours without taking Tylenol or ibuprofen.  Use over-the-counter Tylenol and ibuprofen as needed for body aches and fever.  Use the Atrovent nasal spray, 2 squirts in each nostril every 6 hours, as needed for runny nose and postnasal drip.  Use the Tessalon Perles every 8 hours during the day.  Take them with a small sip of water.  They may give you some numbness to the base of your tongue or a metallic taste in your mouth, this is normal.  Use the Promethazine DM cough syrup at bedtime for cough and congestion.  It will make you drowsy so do not take it during the day.  Take the Paxlovid twice daily for 5 days for treatment of COVID-19. Hold your Lipitor while taking this medication and you can resume 24 hours after completion.  If you develop any increased shortness of breath-especially at rest, you are unable to speak in full sentences, or is a late sign your lips are turning blue you need to go the ER for evaluation.

## 2021-06-01 ENCOUNTER — Other Ambulatory Visit: Payer: Self-pay | Admitting: Internal Medicine

## 2021-06-01 DIAGNOSIS — Z1231 Encounter for screening mammogram for malignant neoplasm of breast: Secondary | ICD-10-CM

## 2021-07-07 ENCOUNTER — Other Ambulatory Visit: Payer: Self-pay

## 2021-07-07 ENCOUNTER — Ambulatory Visit
Admission: RE | Admit: 2021-07-07 | Discharge: 2021-07-07 | Disposition: A | Discharge status: Home / Self Care | Payer: Medicare Other | Source: Ambulatory Visit | Attending: Internal Medicine | Admitting: Internal Medicine

## 2021-07-07 DIAGNOSIS — Z1231 Encounter for screening mammogram for malignant neoplasm of breast: Secondary | ICD-10-CM | POA: Diagnosis not present

## 2022-06-07 ENCOUNTER — Other Ambulatory Visit: Payer: Self-pay | Admitting: Internal Medicine

## 2022-06-07 DIAGNOSIS — Z1231 Encounter for screening mammogram for malignant neoplasm of breast: Secondary | ICD-10-CM

## 2022-07-13 ENCOUNTER — Ambulatory Visit
Admission: RE | Admit: 2022-07-13 | Discharge: 2022-07-13 | Disposition: A | Discharge status: Home / Self Care | Payer: Medicare Other | Source: Ambulatory Visit | Attending: Internal Medicine | Admitting: Internal Medicine

## 2022-07-13 DIAGNOSIS — Z1231 Encounter for screening mammogram for malignant neoplasm of breast: Secondary | ICD-10-CM | POA: Insufficient documentation

## 2022-08-16 ENCOUNTER — Ambulatory Visit
Admission: EM | Admit: 2022-08-16 | Discharge: 2022-08-16 | Disposition: A | Discharge status: Home / Self Care | Payer: Medicare Other | Attending: Family Medicine | Admitting: Family Medicine

## 2022-08-16 DIAGNOSIS — N3001 Acute cystitis with hematuria: Secondary | ICD-10-CM | POA: Insufficient documentation

## 2022-08-16 LAB — URINALYSIS, W/ REFLEX TO CULTURE (INFECTION SUSPECTED)
Bilirubin Urine: NEGATIVE
Glucose, UA: NEGATIVE mg/dL
Ketones, ur: NEGATIVE mg/dL
Nitrite: NEGATIVE
Protein, ur: NEGATIVE mg/dL
Specific Gravity, Urine: 1.01 (ref 1.005–1.030)
pH: 6.5 (ref 5.0–8.0)

## 2022-08-16 MED ORDER — CEPHALEXIN 500 MG PO CAPS: 500.0000 mg | ORAL_CAPSULE | Freq: Four times a day (QID) | ORAL | 0 refills | Status: AC

## 2022-08-16 NOTE — ED Provider Notes (Signed)
[facility]    CSN: [id number] Arrival date & time: 08/16/22  1519      History   Chief Complaint Chief Complaint  Patient presents with   Dysuria     HPI HPI Tracy Charles is a 77 y.o. female.    Tracy Charles presents for urinary urgency and urinary frequency that started this afternoon.  Tried nothing prior to arrival.    - Abnormal vaginal discharge: no - vaginal bleeding: no - Dysuria: yes - Hematuria: no - Urinary urgency: yes - Urinary frequency: yes  - Fever: no - Abdominal pain no - Pelvic pain: no - Rash/Skin lesions/mouth ulcers: no - Nausea: no  - Vomiting: no  - Back Pain: no       Past Medical History:  Diagnosis Date   Cancer    skin ca   Hypertension     There are no problems to display for this patient.   Past Surgical History:  Procedure Laterality Date   ABDOMINAL HYSTERECTOMY      OB History   No obstetric history on file.      Home Medications    Prior to Admission medications   Medication Sig Start Date End Date Taking? Authorizing Provider  atorvastatin (LIPITOR) 20 MG tablet Take 20 mg by mouth daily. 03/30/21  Yes [provider]  cephALEXin (KEFLEX) 500 MG capsule Take 1 capsule (500 mg total) by mouth 4 (four) times daily. 08/16/22  Yes Prajwal Fellner, DO  famotidine (PEPCID) 40 MG tablet Take 1 tablet by mouth at bedtime. 07/28/22  Yes [provider]  fluorouracil (EFUDEX) 5 % cream Apply topically. 08/05/22  Yes [provider]  ipratropium (ATROVENT) 0.06 % nasal spray Place 2 sprays into both nostrils 4 (four) times daily. 05/03/21  Yes Becky Augusta, NP  valsartan (DIOVAN) 80 MG tablet Take 80 mg by mouth daily. 03/10/21  Yes [provider]    Family History Family History  Problem Relation Age of Onset   Breast cancer Maternal Aunt 5   Breast cancer Maternal Grandmother        premenopausal    Social History Social History   Tobacco Use    Smoking status: Never   Smokeless tobacco: Never  Vaping Use   Vaping Use: Never used  Substance Use Topics   Alcohol use: No   Drug use: No     Allergies   Patient has no known allergies.   Review of Systems Review of Systems: :negative unless otherwise stated in HPI.      Physical Exam Triage Vital Signs ED Triage Vitals  Enc Vitals Group     BP 08/16/22 1534 113/68     Pulse Rate 08/16/22 1534 83     Resp 08/16/22 1534 16     Temp 08/16/22 1534 98.3 F (36.8 C)     Temp Source 08/16/22 1534 Oral     SpO2 08/16/22 1534 94 %     Weight 08/16/22 1533 131 lb (59.4 kg)     Height 08/16/22 1533  (1.676 m)     Head Circumference --      Peak Flow --      Pain Score 08/16/22 1537 0     Pain Loc --      Pain Edu? --      Excl. in GC? --    No data found.  [id number] Signs BP 113/68 (BP Location: Left Arm)   Pulse 83   Temp  98.3 F (36.8 C) (Oral)   Resp 16   Ht 5\' 6"  (1.676 m)   Wt 59.4 kg   SpO2 94%   BMI 21.14 kg/m   Visual Acuity Right Eye Distance:   Left Eye Distance:   Bilateral Distance:    Right Eye Near:   Left Eye Near:    Bilateral Near:     Physical Exam GEN: well appearing female in no acute distress  CVS: well perfused  RESP: speaking in full sentences without pause     UC Treatments / Results  Labs (all labs ordered are listed, but only abnormal results are displayed) Labs Reviewed  URINALYSIS, W/ REFLEX TO CULTURE (INFECTION SUSPECTED) - Abnormal; Notable for the following components:      Result Value   Hgb urine dipstick MODERATE (*)    Leukocytes,Ua SMALL (*)    Bacteria, UA FEW (*)    All other components within normal limits  URINE CULTURE    EKG   Radiology No results found.  Procedures Procedures (including critical care time)  Medications Ordered in UC Medications - No data to display  Initial Impression / Assessment and Plan / UC Course  I have reviewed the triage vital signs and the nursing  notes.  Pertinent labs & imaging results that were available during my care of the patient were reviewed by me and considered in my medical decision making (see chart for details).     Acute cystitis:  Patient is a 77 y.o. female  who presents for acute onset dysuria and urinary freqency.  Overall patient is well-appearing and afebrile.  Vital signs stable.  UA consistent with acute cystitis.  Hematuria supported on microscopy.  Treat with Keflex 4 times daily for 5 days.  Urine culture obtained.  Follow-up sensitivities and change antibiotics, if needed. Return precautions including abdominal pain, fever, chills, nausea, or vomiting given.   Discussed MDM, treatment plan and plan for follow-up with patient who agrees with plan.        Final Clinical Impressions(s) / UC Diagnoses   Final diagnoses:  Acute cystitis with hematuria     Discharge Instructions      Your urine showed evidence of a bladder infection.  Stop by the pharmacy to pick up your antibiotics.  I also sent your urine off for culture.  If we need to change antibiotics or stop for some reason, someone will contact you.    ED Prescriptions     Medication Sig Dispense Auth. Provider   cephALEXin (KEFLEX) 500 MG capsule Take 1 capsule (500 mg total) by mouth 4 (four) times daily. 20 capsule Katha Cabal, DO      PDMP not reviewed this encounter.   Katha Cabal, DO 08/16/22 1604

## 2022-08-16 NOTE — ED Triage Notes (Signed)
Pt c/o urinary freq,burning & pain since 1200 today. Denies any hematuria.

## 2022-08-16 NOTE — Discharge Instructions (Addendum)
Your urine showed evidence of a bladder infection.  Stop by the pharmacy to pick up your antibiotics.  I also sent your urine off for culture.  If we need to change antibiotics or stop for some reason, someone will contact you.

## 2022-08-17 LAB — URINE CULTURE: Culture: <10000 — AB

## 2023-06-13 ENCOUNTER — Other Ambulatory Visit: Payer: Self-pay | Admitting: Internal Medicine

## 2023-06-13 DIAGNOSIS — Z1231 Encounter for screening mammogram for malignant neoplasm of breast: Secondary | ICD-10-CM

## 2023-07-19 ENCOUNTER — Ambulatory Visit
Admission: RE | Admit: 2023-07-19 | Discharge: 2023-07-19 | Disposition: A | Discharge status: Home / Self Care | Payer: Commercial Managed Care - PPO | Source: Ambulatory Visit | Attending: Internal Medicine | Admitting: Internal Medicine

## 2023-07-19 DIAGNOSIS — Z1231 Encounter for screening mammogram for malignant neoplasm of breast: Secondary | ICD-10-CM | POA: Diagnosis present
